# Patient Record
Sex: Female | Born: 1970 | Race: White | Hispanic: No | Marital: Single | Smoking: Never smoker
Health system: Southern US, Community
[De-identification: ages and names within clinical notes are randomized; demographics above are authoritative.]

## PROBLEM LIST (undated history)

## (undated) DIAGNOSIS — D219 Benign neoplasm of connective and other soft tissue, unspecified: Secondary | ICD-10-CM

## (undated) DIAGNOSIS — R87629 Unspecified abnormal cytological findings in specimens from vagina: Secondary | ICD-10-CM

## (undated) DIAGNOSIS — F419 Anxiety disorder, unspecified: Secondary | ICD-10-CM

## (undated) DIAGNOSIS — O24419 Gestational diabetes mellitus in pregnancy, unspecified control: Secondary | ICD-10-CM

## (undated) DIAGNOSIS — R0600 Dyspnea, unspecified: Secondary | ICD-10-CM

## (undated) HISTORY — PX: OTHER SURGICAL HISTORY: SHX169

---

## 2011-05-26 ENCOUNTER — Other Ambulatory Visit: Payer: Self-pay

## 2014-02-12 ENCOUNTER — Other Ambulatory Visit: Payer: Self-pay | Admitting: Obstetrics and Gynecology

## 2014-02-12 DIAGNOSIS — Z1231 Encounter for screening mammogram for malignant neoplasm of breast: Secondary | ICD-10-CM

## 2014-03-05 ENCOUNTER — Ambulatory Visit: Payer: Self-pay

## 2014-03-12 ENCOUNTER — Ambulatory Visit
Admission: RE | Admit: 2014-03-12 | Discharge: 2014-03-12 | Disposition: A | Discharge status: Home / Self Care | Payer: BLUE CROSS/BLUE SHIELD | Source: Ambulatory Visit | Attending: Obstetrics and Gynecology | Admitting: Obstetrics and Gynecology

## 2014-03-12 DIAGNOSIS — Z1231 Encounter for screening mammogram for malignant neoplasm of breast: Secondary | ICD-10-CM

## 2015-12-06 IMAGING — MG MM DIGITAL SCREENING BILAT W/ CAD
4 series · 4 of 4 positions shown · non-contrast
Comparison: None.

CLINICAL DATA: Screening.

EXAM:
DIGITAL SCREENING BILATERAL MAMMOGRAM WITH CAD

[R CC]
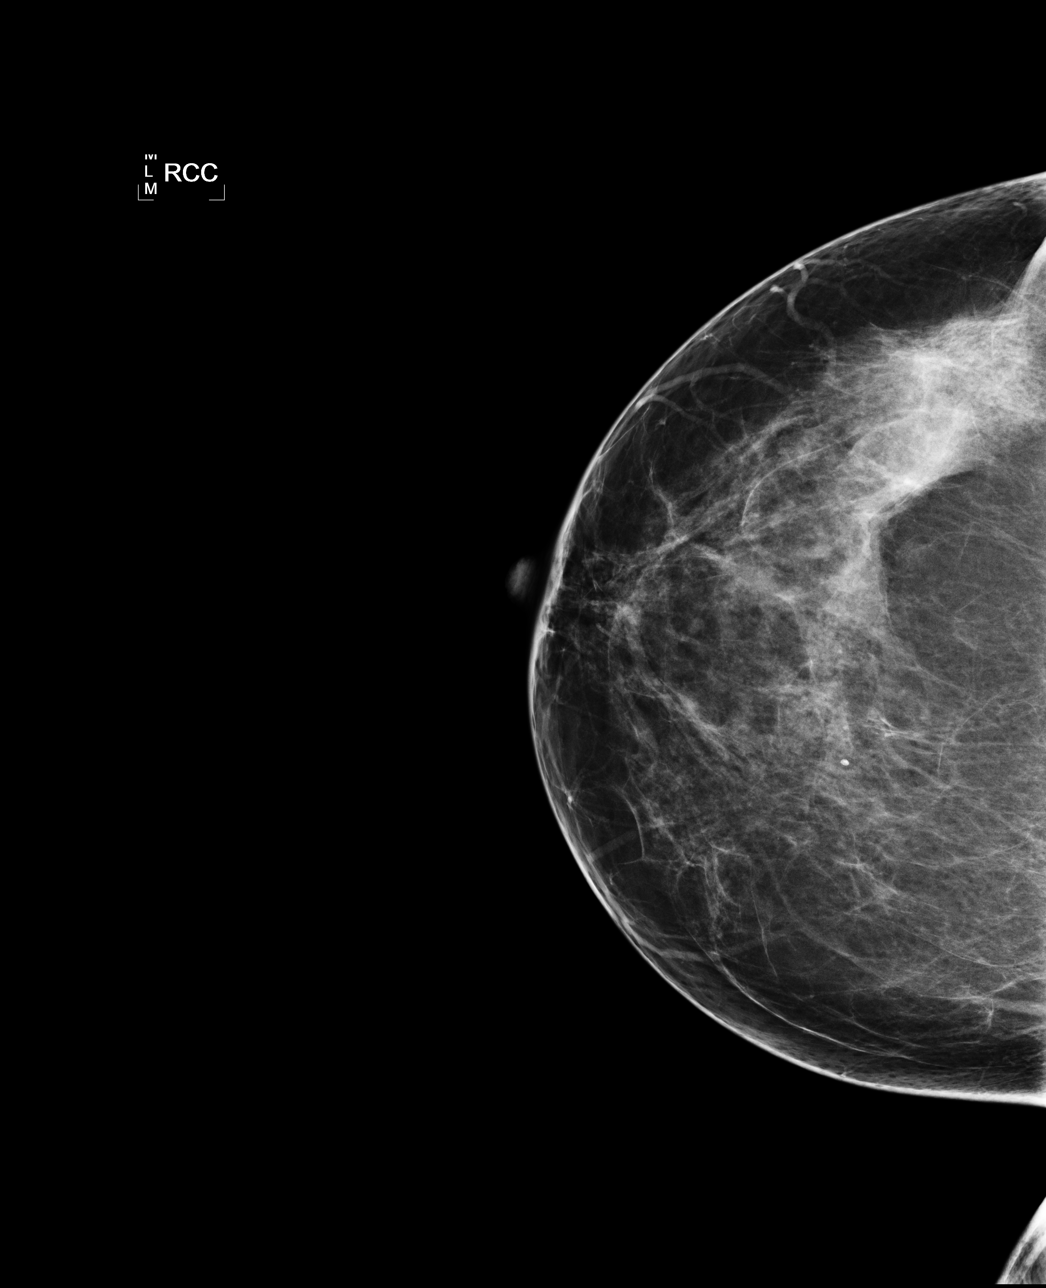

[L CC]
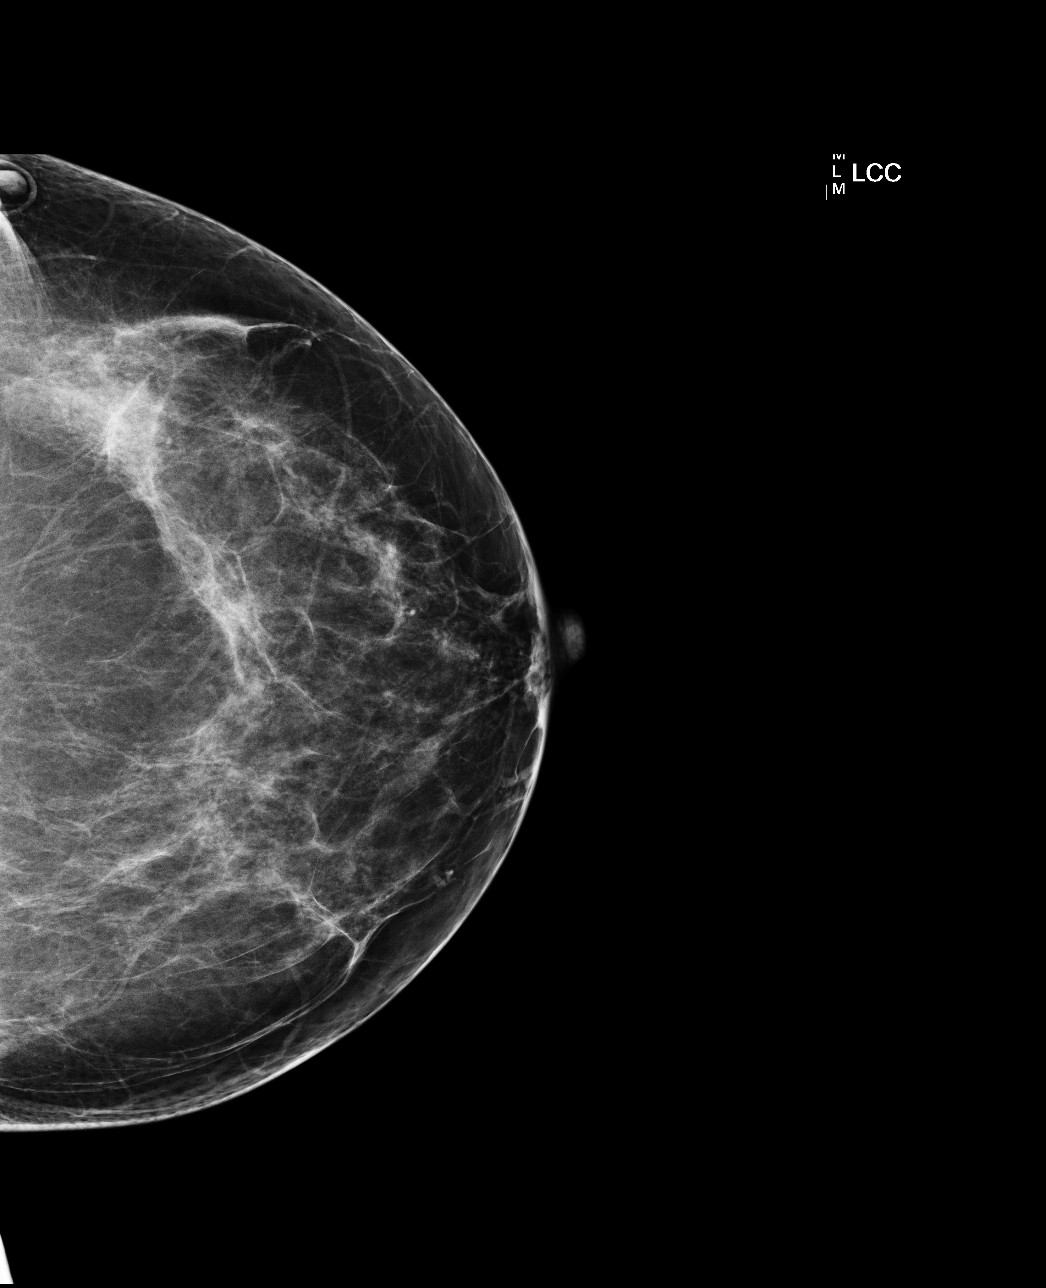

[L MLO]
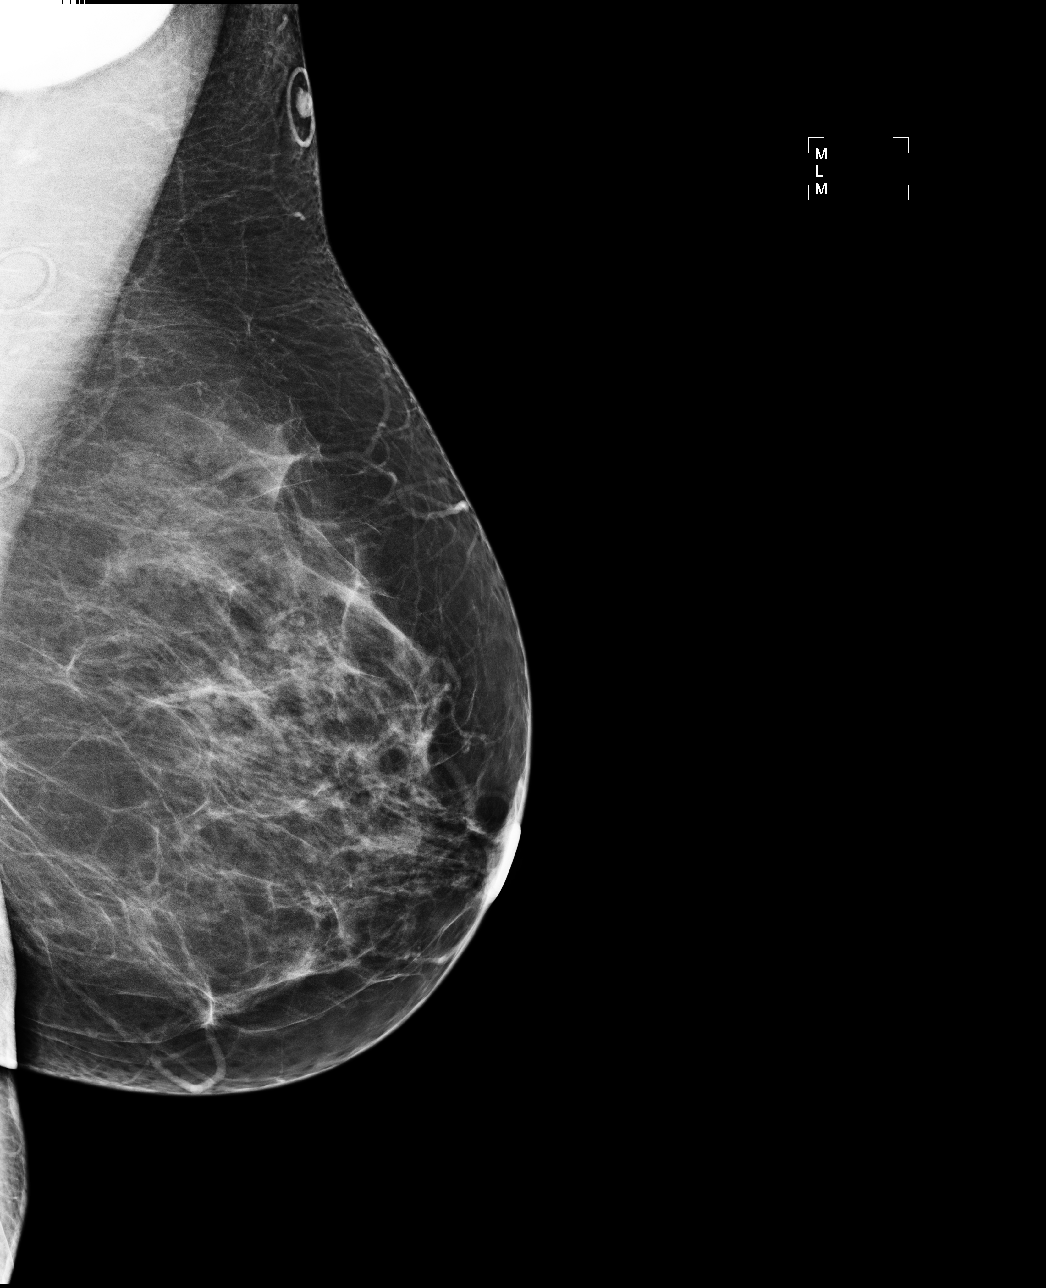

[R MLO]
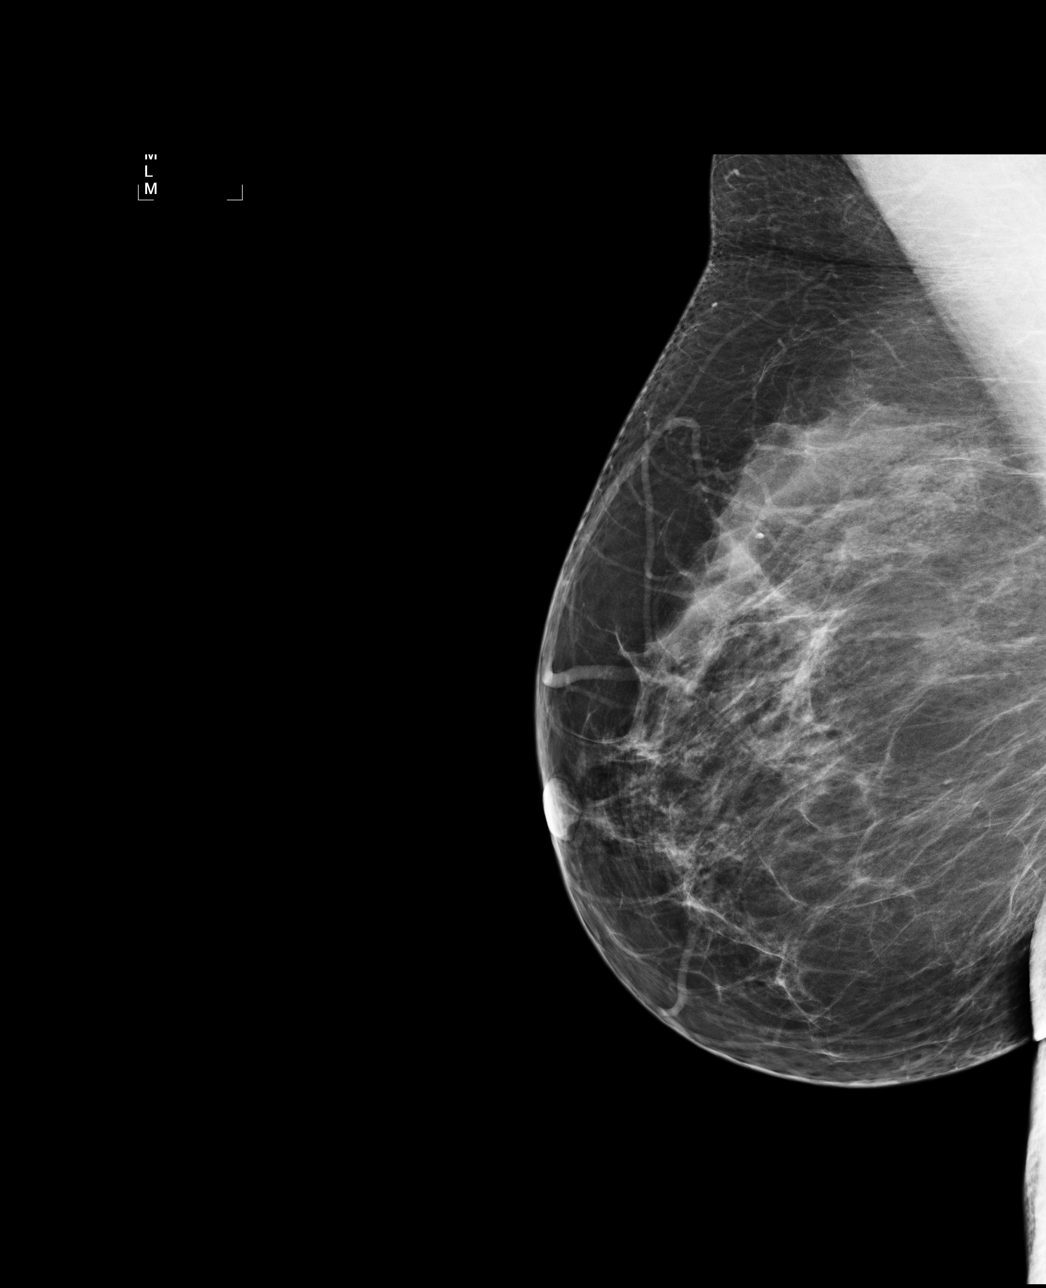

[4 of 4 positions shown; findings below may reference images not displayed]

ACR Breast Density Category b: There are scattered areas of
fibroglandular density.
FINDINGS: There are no findings suspicious for malignancy. Images were
processed with CAD.
IMPRESSION: No mammographic evidence of malignancy. A result letter of this
screening mammogram will be mailed directly to the patient.

RECOMMENDATION:
Screening mammogram in one year. (Code:[id number])

BI-RADS CATEGORY  1: Negative.

## 2017-02-23 ENCOUNTER — Encounter (HOSPITAL_COMMUNITY): Payer: Self-pay | Admitting: Emergency Medicine

## 2017-02-23 ENCOUNTER — Inpatient Hospital Stay (HOSPITAL_COMMUNITY)
Admission: AD | Admit: 2017-02-23 | Discharge: 2017-02-23 | Disposition: A | Discharge status: Home / Self Care | Payer: BLUE CROSS/BLUE SHIELD | Source: Ambulatory Visit | Attending: Obstetrics and Gynecology | Admitting: Obstetrics and Gynecology

## 2017-02-23 DIAGNOSIS — D252 Subserosal leiomyoma of uterus: Secondary | ICD-10-CM | POA: Insufficient documentation

## 2017-02-23 DIAGNOSIS — R000 Tachycardia, unspecified: Secondary | ICD-10-CM | POA: Diagnosis not present

## 2017-02-23 DIAGNOSIS — N920 Excessive and frequent menstruation with regular cycle: Secondary | ICD-10-CM | POA: Diagnosis present

## 2017-02-23 DIAGNOSIS — D251 Intramural leiomyoma of uterus: Secondary | ICD-10-CM | POA: Insufficient documentation

## 2017-02-23 DIAGNOSIS — N924 Excessive bleeding in the premenopausal period: Secondary | ICD-10-CM

## 2017-02-23 HISTORY — DX: Gestational diabetes mellitus in pregnancy, unspecified control: O24.419

## 2017-02-23 HISTORY — DX: Anxiety disorder, unspecified: F41.9

## 2017-02-23 HISTORY — DX: Unspecified abnormal cytological findings in specimens from vagina: R87.629

## 2017-02-23 HISTORY — DX: Benign neoplasm of connective and other soft tissue, unspecified: D21.9

## 2017-02-23 HISTORY — DX: Dyspnea, unspecified: R06.00

## 2017-02-23 LAB — URINALYSIS, ROUTINE W REFLEX MICROSCOPIC
Bilirubin Urine: NEGATIVE
GLUCOSE, UA: 150 mg/dL — AB
Ketones, ur: 20 mg/dL — AB
Leukocytes, UA: NEGATIVE
Nitrite: NEGATIVE
PH: 5 (ref 5.0–8.0)
PROTEIN: 100 mg/dL — AB
SQUAMOUS EPITHELIAL / LPF: NONE SEEN
Specific Gravity, Urine: 1.006 (ref 1.005–1.030)

## 2017-02-23 LAB — CBC WITH DIFFERENTIAL/PLATELET
Basophils Absolute: 0 10*3/uL (ref 0.0–0.1)
Basophils Relative: 0 %
EOS ABS: 0.1 10*3/uL (ref 0.0–0.7)
Eosinophils Relative: 1 %
HCT: 27.1 % — ABNORMAL LOW (ref 36.0–46.0)
Hemoglobin: 8.8 g/dL — ABNORMAL LOW (ref 12.0–15.0)
LYMPHS ABS: 5.4 10*3/uL — AB (ref 0.7–4.0)
Lymphocytes Relative: 34 %
MCH: 29.2 pg (ref 26.0–34.0)
MCHC: 32.5 g/dL (ref 30.0–36.0)
MCV: 90 fL (ref 78.0–100.0)
Monocytes Absolute: 0.5 10*3/uL (ref 0.1–1.0)
Monocytes Relative: 3 %
NEUTROS PCT: 62 %
Neutro Abs: 10 10*3/uL — ABNORMAL HIGH (ref 1.7–7.7)
PLATELETS: 256 10*3/uL (ref 150–400)
RBC: 3.01 MIL/uL — AB (ref 3.87–5.11)
RDW: 13.2 % (ref 11.5–15.5)
WBC: 16 10*3/uL — AB (ref 4.0–10.5)

## 2017-02-23 LAB — CBC
HEMATOCRIT: 24.9 % — AB (ref 36.0–46.0)
HEMOGLOBIN: 8.2 g/dL — AB (ref 12.0–15.0)
MCH: 29.9 pg (ref 26.0–34.0)
MCHC: 32.9 g/dL (ref 30.0–36.0)
MCV: 90.9 fL (ref 78.0–100.0)
Platelets: 229 10*3/uL (ref 150–400)
RBC: 2.74 MIL/uL — ABNORMAL LOW (ref 3.87–5.11)
RDW: 12.7 % (ref 11.5–15.5)
WBC: 15.6 10*3/uL — ABNORMAL HIGH (ref 4.0–10.5)

## 2017-02-23 LAB — BASIC METABOLIC PANEL
Anion gap: 13 (ref 5–15)
BUN: 16 mg/dL (ref 6–20)
CO2: 17 mmol/L — ABNORMAL LOW (ref 22–32)
Calcium: 8.3 mg/dL — ABNORMAL LOW (ref 8.9–10.3)
Chloride: 104 mmol/L (ref 101–111)
Creatinine, Ser: 0.83 mg/dL (ref 0.44–1.00)
GFR calc Af Amer: >60 mL/min (ref >60)
Glucose, Bld: 244 mg/dL — ABNORMAL HIGH (ref 65–99)
Potassium: 3.7 mmol/L (ref 3.5–5.1)
SODIUM: 134 mmol/L — AB (ref 135–145)

## 2017-02-23 MED ORDER — LACTATED RINGERS IV BOLUS (SEPSIS)
1000.0000 mL | Freq: Once | INTRAVENOUS | Status: AC
  Administered 2017-02-23: 1000 mL via INTRAVENOUS

## 2017-02-23 NOTE — Discharge Instructions (Signed)

## 2017-02-23 NOTE — MAU Note (Addendum)
Pt presents to mau with severe vaginal bleeding that started late Saturday night. The pt states she wears a super plus tampon, 9 hour pad and a bunch of paper towels and she soaks through all of those every 30 mins to an hour. Pt states SOB started this morning.  Pt was sent here by her doctor.  Pt states that she has 8 fibroids confirmed by Ultrasound.  Pt states that she doesn't usually have periods bc she is on the depo shot for past 2 years and she receives this shot every 3 months.

## 2017-02-23 NOTE — MAU Provider Note (Signed)
History    Kathleen Carpenter is a 46y.o. Female who presents, after office visit, for lab testing. Patient states bleeding started on Friday and has been requiring the changing of a super plus tampon and pad q 30 minutes with passing of golf ball sized clots.  Patient reports feelings of fatigue, SOB, and chest pain prior to infusion of IV fluids.  Patient states she has been on depo-provera injections for the last 2 years without incident or menses.  Patient taking ibuprofen for pain with good results.  Patient was seen at [facility] today by E. Florene Glen, PA for menorrhagia. US revealed multiple small fibroids and endometrial lining of 8.38mm.  Patient also given oral contraception for bleeding management.   There are no active problems to display for this patient.   Chief Complaint  Patient presents with  . Vaginal Bleeding   HPI  OB History    Gravida Para Term Preterm AB Living   3 2     1 2    SAB TAB Ectopic Multiple Live Births   1              Past Medical History:  Diagnosis Date  . Anxiety   . Dyspnea   . Fibroid   . Gestational diabetes   . Preterm labor   . Vaginal Pap smear, abnormal     Past Surgical History:  Procedure Laterality Date  . turmer removal Right    cystal turmor on right wrist    Family History  Problem Relation Age of Onset  . Cancer Mother   . Diabetes Father     Social History   Tobacco Use  . Smoking status: Never Smoker  . Smokeless tobacco: Never Used  Substance Use Topics  . Alcohol use: Yes  . Drug use: No    Allergies: No Known Allergies  Medications Prior to Admission  Medication Sig Dispense Refill Last Dose  . Phentermine-Topiramate (QSYMIA) 7.5-46 MG CP24 Take by mouth.   02/23/2017 at Unknown time    ROS  See HPI Above Physical Exam   Blood pressure 128/78, pulse (!) 125, temperature 98.1 F (36.7 C), temperature source Oral, resp. rate 20, height 4\' 11"  (1.499 m), weight 83.9 kg (185 lb), SpO2 100 %.  Results for orders  placed or performed during the hospital encounter of 02/23/17 (from the past 24 hour(s))  CBC with Differential/Platelet     Status: Abnormal   Collection Time: 02/23/17  7:05 PM  Result Value Ref Range   WBC 16.0 (H) 4.0 - 10.5 K/uL   RBC 3.01 (L) 3.87 - 5.11 MIL/uL   Hemoglobin 8.8 (L) 12.0 - 15.0 g/dL   HCT 27.1 (L) 36.0 - 46.0 %   MCV 90.0 78.0 - 100.0 fL   MCH 29.2 26.0 - 34.0 pg   MCHC 32.5 30.0 - 36.0 g/dL   RDW 13.2 11.5 - 15.5 %   Platelets 256 150 - 400 K/uL   Neutrophils Relative % 62 %   Neutro Abs 10.0 (H) 1.7 - 7.7 K/uL   Lymphocytes Relative 34 %   Lymphs Abs 5.4 (H) 0.7 - 4.0 K/uL   Monocytes Relative 3 %   Monocytes Absolute 0.5 0.1 - 1.0 K/uL   Eosinophils Relative 1 %   Eosinophils Absolute 0.1 0.0 - 0.7 K/uL   Basophils Relative 0 %   Basophils Absolute 0.0 0.0 - 0.1 K/uL  Basic metabolic panel     Status: Abnormal   Collection Time: 02/23/17  7:05  PM  Result Value Ref Range   Sodium 134 (L) 135 - 145 mmol/L   Potassium 3.7 3.5 - 5.1 mmol/L   Chloride 104 101 - 111 mmol/L   CO2 17 (L) 22 - 32 mmol/L   Glucose, Bld 244 (H) 65 - 99 mg/dL   BUN 16 6 - 20 mg/dL   Creatinine, Ser 0.83 0.44 - 1.00 mg/dL   Calcium 8.3 (L) 8.9 - 10.3 mg/dL   GFR calc non Af Amer >60 >60 mL/min   GFR calc Af Amer >60 >60 mL/min   Anion gap 13 5 - 15  Urinalysis, Routine w reflex microscopic     Status: Abnormal   Collection Time: 02/23/17  7:16 PM  Result Value Ref Range   Color, Urine AMBER (A) YELLOW   APPearance CLEAR CLEAR   Specific Gravity, Urine 1.006 1.005 - 1.030   pH 5.0 5.0 - 8.0   Glucose, UA 150 (A) NEGATIVE mg/dL   Hgb urine dipstick LARGE (A) NEGATIVE   Bilirubin Urine NEGATIVE NEGATIVE   Ketones, ur 20 (A) NEGATIVE mg/dL   Protein, ur 100 (A) NEGATIVE mg/dL   Nitrite NEGATIVE NEGATIVE   Leukocytes, UA NEGATIVE NEGATIVE   RBC / HPF TOO NUMEROUS TO COUNT 0 - 5 RBC/hpf   WBC, UA TOO NUMEROUS TO COUNT 0 - 5 WBC/hpf   Bacteria, UA RARE (A) NONE SEEN    Squamous Epithelial / LPF NONE SEEN NONE SEEN  CBC     Status: Abnormal   Collection Time: 02/23/17  9:18 PM  Result Value Ref Range   WBC 15.6 (H) 4.0 - 10.5 K/uL   RBC 2.74 (L) 3.87 - 5.11 MIL/uL   Hemoglobin 8.2 (L) 12.0 - 15.0 g/dL   HCT 24.9 (L) 36.0 - 46.0 %   MCV 90.9 78.0 - 100.0 fL   MCH 29.9 26.0 - 34.0 pg   MCHC 32.9 30.0 - 36.0 g/dL   RDW 12.7 11.5 - 15.5 %   Platelets 229 150 - 400 K/uL    Physical Exam  Constitutional: She is oriented to person, place, and time. She appears well-developed and well-nourished. No distress.  HENT:  Head: Normocephalic and atraumatic.  Eyes: Conjunctivae are normal.  Neck: Normal range of motion.  Cardiovascular: Normal rate.  Respiratory: Effort normal.  GI: Soft.  Genitourinary:  Genitourinary Comments: Deferred as completed in office today.   Musculoskeletal: Normal range of motion.  Neurological: She is alert and oriented to person, place, and time.  Skin: Skin is warm and dry.    Anteverted Uterus 6.08 cm x 5.81 cm 7.71 cm Endo thickness 8.63 mm R ovary 2.78 cm L ovary 2.37 cm  Fibroid 1 1.45 cm R anterior fundal intramural Fibroid 2 2.54 cm R fundal posterior intramural Fibroid 3 1.66 cm Posterior fundal intramural Fibroid 4 1.90 cm Posterior fundal intramural Fibroid 5 2.24 cm L fundal intramural Fibroid 6 2.22 cm L mid anterior intramural Fibroid 7 2.64 cm L posterior fundal subserosal Fibroid 8 2.04 cm L posterior fundal subserosal ED Course  Assessment: 46y.o. Female Menorrhagia  Depo Provera  Tachycardia  Plan: -IV hydration -CBC now  -BMET -Await results  Follow Up (2100) -CBC reviewed (8.8) -Discussed r/b of blood transfusion including: Infection, Reaction, Relief of symptoms including fatigue, SOB, tachycardia, and dizziness/lightheadedness. -Patient verbalizes understanding and declines transfusion at current.Patient reports improvement of symptoms with initiation of LR infusion. Patient  informed that transfusion is available at anytime.  -Will repeat CBC once IV hydration complete  Follow Up (2220) -CBC reviewed (8.2) -Patient continues to report improvement with hydration -Discussed oral contraception management as discussed in office; take 3 pills daily x 4 days, 2 pills daily x 3 days, then daily until pack complete -Follow up in office in one month or prn  -Informed to call if symptoms worsen -No q/c -Discharged to home in stable condition  [place], MSN 02/23/2017 8:26 PM

## 2017-05-10 ENCOUNTER — Other Ambulatory Visit: Payer: Self-pay | Admitting: Obstetrics and Gynecology

## 2017-05-19 ENCOUNTER — Encounter (HOSPITAL_COMMUNITY): Payer: BLUE CROSS/BLUE SHIELD

## 2017-05-28 ENCOUNTER — Telehealth: Payer: Self-pay | Admitting: Obstetrics & Gynecology

## 2017-05-28 MED ORDER — MEDROXYPROGESTERONE ACETATE 10 MG PO TABS: ORAL_TABLET | ORAL | 42 refills | Status: AC

## 2017-05-28 NOTE — Telephone Encounter (Signed)
47 yo female with hx of uterine fibroids called with complaint of heavy vaginal bleeding that started last night.  Changing super plus tampon about every hour and a half.  When removes, she has also noted large clots.  H/o DUB this past year.  Was hospitalized in December due to heavy bleeding resulting in anemia.  Hb was 8.2  Did not need a blood transfusion.  Was started on an OCP taper that resolved bleeding for about a month.  Has continued Depo Provera as well.  Pt is unsure about the exact pill she was given at it was a sample in the office.  When hospitalized, she was tachycardic with SOB.  She is not having any of these symptoms at this time but worried she will become anemic again.  She denies palpitations, shortness of breath, lightheaded sensation.  Drinking lots of fluid.  Is taking iron.  Doesn't feel she needs to go to the ER and really needs to work today if at all possible.  Per pt, recent Hb was near 12.  Requested OCP taper if possible.  As I am not sure what she was taking, d/w pt trial of high dosed progesterone.  Side effects, risks, typical response discussed.  Pt does have appt on Monday at 10:30AM with Dr. Leo Grosser.  Provera 20mg  TID given.  Advised to take until appt unless bleeding does not improve.  Provided pt with my direct contact information to call with questions/concerns.  Also reviewed reasons to go to MAU for evaluation.  Voices clear understanding.  Questions answered.  Pmed hx, medications, allergies reviewed prior to sending rx to pharmacy.  Pt was scheduled for hysterectomy but cancelled due to work issues.  Feels like sh eis just going to have to proceed with that plan but will discussed with DR. Haygood on Monday.  Appreciative of phone call.

## 2017-08-25 ENCOUNTER — Encounter (HOSPITAL_BASED_OUTPATIENT_CLINIC_OR_DEPARTMENT_OTHER): Payer: Self-pay

## 2017-08-25 ENCOUNTER — Ambulatory Visit (HOSPITAL_BASED_OUTPATIENT_CLINIC_OR_DEPARTMENT_OTHER): Admit: 2017-08-25 | Payer: BLUE CROSS/BLUE SHIELD | Admitting: Obstetrics and Gynecology

## 2017-08-25 SURGERY — HYSTERECTOMY, VAGINAL
Anesthesia: General | Laterality: Bilateral

## 2017-12-20 ENCOUNTER — Ambulatory Visit (HOSPITAL_BASED_OUTPATIENT_CLINIC_OR_DEPARTMENT_OTHER): Admit: 2017-12-20 | Payer: BLUE CROSS/BLUE SHIELD | Admitting: Obstetrics and Gynecology

## 2017-12-20 ENCOUNTER — Encounter (HOSPITAL_BASED_OUTPATIENT_CLINIC_OR_DEPARTMENT_OTHER): Payer: Self-pay

## 2017-12-20 SURGERY — HYSTERECTOMY, VAGINAL
Anesthesia: General | Laterality: Bilateral

## 2019-06-16 ENCOUNTER — Ambulatory Visit: Payer: Self-pay | Attending: Internal Medicine

## 2019-06-16 DIAGNOSIS — Z23 Encounter for immunization: Secondary | ICD-10-CM

## 2019-06-16 NOTE — Progress Notes (Signed)
   Covid-19 Vaccination Clinic  Name:  Kathleen Carpenter    MRN: [id number] DOB: [birth year]  06/16/2019  Ms. Domingos was observed post Covid-19 immunization for 15 minutes without incident. She was provided with Vaccine Information Sheet and instruction to access the V-Safe system.   Ms. Coffel was instructed to call 911 with any severe reactions post vaccine: Marland Kitchen Difficulty breathing  . Swelling of face and throat  . A fast heartbeat  . A bad rash all over body  . Dizziness and weakness   Immunizations Administered    Name Date Dose VIS Date Route   Pfizer COVID-19 Vaccine 06/16/2019 10:32 AM 0.3 mL 02/16/2019 Intramuscular   Manufacturer: [place]   Lot: [id number]   [place]: [id number]

## 2019-07-09 ENCOUNTER — Ambulatory Visit: Payer: Self-pay | Attending: Internal Medicine

## 2019-07-09 DIAGNOSIS — Z23 Encounter for immunization: Secondary | ICD-10-CM

## 2019-07-09 NOTE — Progress Notes (Signed)
   Covid-19 Vaccination Clinic  Name:  Kathleen Carpenter    MRN: [id number] DOB: 11/30/1970  07/09/2019  [name] was observed post Covid-19 immunization for 15 minutes without incident. She was provided with Vaccine Information Sheet and instruction to access the V-Safe system.   [name] was instructed to call 911 with any severe reactions post vaccine: Marland Kitchen Difficulty breathing  . Swelling of face and throat  . A fast heartbeat  . A bad rash all over body  . Dizziness and weakness   Immunizations Administered    Name Date Dose VIS Date [place] COVID-19 Vaccine 07/09/2019 12:09 PM 0.3 mL 05/02/2018 Intramuscular   Manufacturer: [place]   Lot: [id number]   [place]: [id number]
